# Patient Record
Sex: Male | Born: 1995 | Race: White | Hispanic: No | Marital: Single | Smoking: Former smoker
Health system: Southern US, Community
[De-identification: ages and names within clinical notes are randomized; demographics above are authoritative.]

## PROBLEM LIST (undated history)

## (undated) HISTORY — PX: HERNIA REPAIR: SHX51

## (undated) HISTORY — PX: TONSILLECTOMY: SUR1361

---
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
## 2014-01-08 ENCOUNTER — Emergency Department: Payer: Self-pay | Admitting: Emergency Medicine

## 2015-01-11 ENCOUNTER — Emergency Department
Admit: 2015-01-11 | Disposition: A | Discharge status: Home / Self Care | Payer: Self-pay | Admitting: Emergency Medicine

## 2017-07-18 ENCOUNTER — Encounter: Payer: Self-pay | Admitting: Emergency Medicine

## 2017-07-18 ENCOUNTER — Emergency Department
Admission: EM | Admit: 2017-07-18 | Discharge: 2017-07-18 | Disposition: A | Discharge status: Home / Self Care | Payer: Self-pay | Attending: Emergency Medicine | Admitting: Emergency Medicine

## 2017-07-18 ENCOUNTER — Emergency Department: Payer: Self-pay

## 2017-07-18 DIAGNOSIS — F17210 Nicotine dependence, cigarettes, uncomplicated: Secondary | ICD-10-CM | POA: Insufficient documentation

## 2017-07-18 DIAGNOSIS — Y929 Unspecified place or not applicable: Secondary | ICD-10-CM | POA: Insufficient documentation

## 2017-07-18 DIAGNOSIS — Y9351 Activity, roller skating (inline) and skateboarding: Secondary | ICD-10-CM | POA: Insufficient documentation

## 2017-07-18 DIAGNOSIS — Y999 Unspecified external cause status: Secondary | ICD-10-CM | POA: Insufficient documentation

## 2017-07-18 DIAGNOSIS — X501XXA Overexertion from prolonged static or awkward postures, initial encounter: Secondary | ICD-10-CM | POA: Insufficient documentation

## 2017-07-18 DIAGNOSIS — S93401A Sprain of unspecified ligament of right ankle, initial encounter: Secondary | ICD-10-CM | POA: Insufficient documentation

## 2017-07-18 MED ORDER — TRAMADOL HCL 50 MG PO TABS: 50.0000 mg | ORAL_TABLET | Freq: Two times a day (BID) | ORAL | 0 refills | Status: DC

## 2017-07-18 MED ORDER — NABUMETONE 750 MG PO TABS: 750.0000 mg | ORAL_TABLET | Freq: Two times a day (BID) | ORAL | 0 refills | Status: DC

## 2017-07-18 MED ORDER — OXYCODONE-ACETAMINOPHEN 5-325 MG PO TABS
1.0000 | ORAL_TABLET | Freq: Once | ORAL | Status: AC
  Administered 2017-07-18: 1 via ORAL
  Filled 2017-07-18: qty 1

## 2017-07-18 NOTE — ED Triage Notes (Signed)
Pt arrived with complaints of right ankle pain. Pt reports skateboarding and had two back to back injuries to ankle within the last hour. Pt's ankle swollen and tender. Pt able to move toes and pulses palpable.

## 2017-07-18 NOTE — ED Provider Notes (Signed)
[facility] Emergency Department Provider Note ____________________________________________  Time seen: 2136  I have reviewed the triage vital signs and the nursing notes.  HISTORY  Chief Complaint  Ankle Pain  HPI Hayden Medina is a 21 y.o. male patient presents to the ED, accompanied by a friend, for evaluation of injury to the right ankle.  He describes 2 separate ankle injuries while skateboarding today.  Initially he describes failing to land a jump of about 5 feet and landed on his right foot.  He then repaired reports another injury where he went to stand up after a fall, and rolled his right ankle.  He presents now with lateral ankle pain and swelling.  He reports pain is increased with attempts to ambulate.  He denies any other injury at this time.  History reviewed. No pertinent past medical history.  There are no active problems to display for this patient.  Past Surgical History:  Procedure Laterality Date  . HERNIA REPAIR    . TONSILLECTOMY      Prior to Admission medications   Medication Sig Start Date End Date Taking? Authorizing Provider  nabumetone (RELAFEN) 750 MG tablet Take 1 tablet (750 mg total) 2 (two) times daily by mouth. 07/18/17   Audiel Scheiber, Charlesetta Ivory, PA-C  traMADol (ULTRAM) 50 MG tablet Take 1 tablet (50 mg total) 2 (two) times daily by mouth. 07/18/17   Lesha Jager, Charlesetta Ivory, PA-C    Allergies Augmentin [amoxicillin-pot clavulanate]  No family history on file.  Social History Social History   Tobacco Use  . Smoking status: Current Every Day Smoker  . Smokeless tobacco: Never Used  Substance Use Topics  . Alcohol use: No    Frequency: Never  . Drug use: No    Review of Systems  Constitutional: Negative for fever. Musculoskeletal: Negative for back pain.  Ankle as above. Skin: Negative for rash. Neurological: Negative for headaches, focal weakness or  numbness. ____________________________________________  PHYSICAL EXAM:  VITAL SIGNS: ED Triage Vitals  Enc Vitals Group     BP 07/18/17 2042 (!) 125/57     Pulse Rate 07/18/17 2042 76     Resp 07/18/17 2042 18     Temp 07/18/17 2042 98.8 F (37.1 C)     Temp Source 07/18/17 2042 Oral     SpO2 07/18/17 2042 100 %     Weight 07/18/17 2041 130 lb (59 kg)     Height 07/18/17 2041 5\' 11"  (1.803 m)     Head Circumference --      Peak Flow --      Pain Score 07/18/17 2041 9     Pain Loc --      Pain Edu? --      Excl. in GC? --     Constitutional: Alert and oriented. Well appearing and in no distress. Head: Normocephalic and atraumatic. Cardiovascular: Normal rate, regular rhythm. Normal distal pulses and cap refill. Respiratory: Normal respiratory effort.  Musculoskeletal: Right ankle with obvious soft tissue swelling about the lateral malleolus.  Patient is tender to palpation over the ATF, CF, and PTF ligaments respectively.  There is also tenderness to palpation over the lateral malleolus.  Early ecchymosis noted to the lateral aspect of the foot.  Patient is able to flex and extend the toes without difficulty.  He is also able to demonstrate flexion extension range at the ankle, however limited by his pain.  No calf or Achilles tendons is appreciated.  Nontender with normal range  of motion in all extremities.  Neurologic:  Normal gross sensation. Normal speech and language. No gross focal neurologic deficits are appreciated. Skin:  Skin is warm, dry and intact. No rash noted. ____________________________________________   RADIOLOGY  Right Ankle  ____________________________________________  PROCEDURES  Percocet 5-325 mg PO Stirrup splint crutches ____________________________________________  INITIAL IMPRESSION / ASSESSMENT AND PLAN / ED COURSE  Patient with ED evaluation of acute right ankle sprain and swelling.  His exam and x-ray are consistent with a right ankle  sprain without underlying fracture or dislocation.  Space in a stirrup splint and given crutches to ambulate with weightbearing as tolerated.  Prescriptions for Relafen and Ultram are provided for pain relief he is referred to podiatry for ongoing symptom management. ____________________________________________  FINAL CLINICAL IMPRESSION(S) / ED DIAGNOSES  Final diagnoses:  Sprain of right ankle, unspecified ligament, initial encounter     Lissa HoardMenshew, Leean Amezcua V Bacon, PA-C 07/18/17 2217    Sharman CheekStafford, Phillip, MD 07/18/17 [phone]

## 2017-07-18 NOTE — Discharge Instructions (Signed)
Your exam and x-ray are consistent with a Grade II ankle sprain. Wear the ankle stirrup as needed for the next week or so. Rest with the leg elevated to reduce pain and swelling. Use the crutches to ambulate, until you can bear weight through your ankle. Take the prescription meds as directed. Follow-up with Dr. Orland Jarredroxler for ongoing symptoms. Begin the ankle exercises tomorrow, with simple, slow range of motion.

## 2017-10-20 ENCOUNTER — Other Ambulatory Visit: Payer: Self-pay

## 2017-10-20 ENCOUNTER — Encounter: Payer: Self-pay | Admitting: Emergency Medicine

## 2017-10-20 ENCOUNTER — Emergency Department: Payer: Medicaid Other

## 2017-10-20 ENCOUNTER — Emergency Department
Admission: EM | Admit: 2017-10-20 | Discharge: 2017-10-20 | Disposition: A | Discharge status: Home / Self Care | Payer: Medicaid Other | Attending: Emergency Medicine | Admitting: Emergency Medicine

## 2017-10-20 DIAGNOSIS — Y939 Activity, unspecified: Secondary | ICD-10-CM | POA: Insufficient documentation

## 2017-10-20 DIAGNOSIS — Y929 Unspecified place or not applicable: Secondary | ICD-10-CM | POA: Insufficient documentation

## 2017-10-20 DIAGNOSIS — Z79899 Other long term (current) drug therapy: Secondary | ICD-10-CM | POA: Insufficient documentation

## 2017-10-20 DIAGNOSIS — W109XXA Fall (on) (from) unspecified stairs and steps, initial encounter: Secondary | ICD-10-CM | POA: Insufficient documentation

## 2017-10-20 DIAGNOSIS — S93491A Sprain of other ligament of right ankle, initial encounter: Secondary | ICD-10-CM

## 2017-10-20 DIAGNOSIS — M25571 Pain in right ankle and joints of right foot: Secondary | ICD-10-CM

## 2017-10-20 DIAGNOSIS — F17200 Nicotine dependence, unspecified, uncomplicated: Secondary | ICD-10-CM | POA: Insufficient documentation

## 2017-10-20 DIAGNOSIS — Y999 Unspecified external cause status: Secondary | ICD-10-CM | POA: Insufficient documentation

## 2017-10-20 MED ORDER — FENTANYL CITRATE (PF) 100 MCG/2ML IJ SOLN
50.0000 ug | INTRAMUSCULAR | Status: AC | PRN
  Administered 2017-10-20 (×2): 50 ug via NASAL
  Filled 2017-10-20: qty 2

## 2017-10-20 MED ORDER — ONDANSETRON 4 MG PO TBDP
ORAL_TABLET | ORAL | Status: AC
  Filled 2017-10-20: qty 1

## 2017-10-20 MED ORDER — IBUPROFEN 800 MG PO TABS: 800.0000 mg | ORAL_TABLET | Freq: Three times a day (TID) | ORAL | 0 refills | Status: DC | PRN

## 2017-10-20 MED ORDER — ONDANSETRON 4 MG PO TBDP
4.0000 mg | ORAL_TABLET | Freq: Once | ORAL | Status: AC | PRN
  Administered 2017-10-20: 4 mg via ORAL

## 2017-10-20 NOTE — ED Triage Notes (Signed)
Patient to ER for c/o right ankle pain after ankle twisting while walking down stairs. Patient states he was already using crutches d/t tendon injury from skateboarding a few weeks ago. Patient does report falling, did not injure anything other than ankle. Patient has obvious deformity to right ankle and appears to be in large amount of pain.

## 2017-10-20 NOTE — ED Notes (Signed)
Pt has noticeable swelling with some slight bruising to the outside of the right ankle. Pt unable move ankle but is able to wiggle toes. Circulation is still intact

## 2017-10-20 NOTE — ED Provider Notes (Signed)
[facility] EMERGENCY DEPARTMENT Provider Note   CSN: [id number] Arrival date & time: 10/20/17  2056     History   Chief Complaint Chief Complaint  Patient presents with  . Ankle Pain    HPI Hayden Medina is a 22 y.o. male.  Patient states he was walking down steps when he rolled his right ankle.  He has lateral ankle pain denies any medial ankle pain denies any other pain throughout the lower extremity such as the knee or hip.  He denies any discomfort throughout the foot.  No head injury, neck or back pain.  His pain is moderate.  He is unable to bear weight on the right lower extremity.  HPI  History reviewed. No pertinent past medical history.  There are no active problems to display for this patient.   Past Surgical History:  Procedure Laterality Date  . HERNIA REPAIR    . TONSILLECTOMY         Home Medications    Prior to Admission medications   Medication Sig Start Date End Date Taking? Authorizing Provider  ibuprofen (ADVIL,MOTRIN) 800 MG tablet Take 1 tablet (800 mg total) by mouth every 8 (eight) hours as needed. 10/20/17   Evon SlackGaines, Thomas C, PA-C  nabumetone (RELAFEN) 750 MG tablet Take 1 tablet (750 mg total) 2 (two) times daily by mouth. 07/18/17   Menshew, Charlesetta IvoryJenise V Bacon, PA-C  traMADol (ULTRAM) 50 MG tablet Take 1 tablet (50 mg total) 2 (two) times daily by mouth. 07/18/17   Menshew, Charlesetta IvoryJenise V Bacon, PA-C    Family History No family history on file.  Social History Social History   Tobacco Use  . Smoking status: Current Every Day Smoker  . Smokeless tobacco: Never Used  Substance Use Topics  . Alcohol use: No    Frequency: Never  . Drug use: No     Allergies   Augmentin [amoxicillin-pot clavulanate]   Review of Systems Review of Systems  Constitutional: Negative for fever.  Respiratory: Negative for shortness of breath.   Cardiovascular: Negative for chest pain.  Musculoskeletal: Positive for arthralgias and gait  problem. Negative for back pain, myalgias and neck pain.  Skin: Negative for rash.  Neurological: Negative for dizziness and headaches.     Physical Exam Updated Vital Signs BP 105/87 (BP Location: Right Arm)   Pulse (!) 103   Temp 98.7 F (37.1 C) (Axillary)   Resp (!) 24   Ht 5\' 11"  (1.803 m)   Wt 68 kg (150 lb)   SpO2 100%   BMI 20.92 kg/m   Physical Exam  Constitutional: He is oriented to person, place, and time. He appears well-developed and well-nourished.  HENT:  Head: Normocephalic and atraumatic.  Eyes: Conjunctivae are normal.  Neck: Normal range of motion.  Cardiovascular: Normal rate.  Pulmonary/Chest: Effort normal. No respiratory distress.  Musculoskeletal:  Examination of the right lower extremity shows patient has soft tissue swelling along the lateral aspect the ankle.  Patient is tender along the lateral malleolus and ATFL ligament.  Nontender along the medial malleolus.  Patient has limited ankle plantarflexion and dorsiflexion but has a negative Thompson sign.  Patient is nontender along the Achilles tendon.  No tenderness throughout the metatarsals or calcaneus.  He is nontender throughout the knee or mid to proximal tib-fib region.  Neurological: He is alert and oriented to person, place, and time.  Skin: Skin is warm. No rash noted.  Psychiatric: He has a normal mood and affect.  His behavior is normal. Thought content normal.     ED Treatments / Results  Labs (all labs ordered are listed, but only abnormal results are displayed) Labs Reviewed - No data to display  EKG  EKG Interpretation None       Radiology Dg Ankle Complete Right  Result Date: 10/20/2017 CLINICAL DATA:  22 y/o  M; twisting injury with pain. EXAM: RIGHT ANKLE - COMPLETE 3+ VIEW COMPARISON:  07/18/2017 right ankle radiographs. FINDINGS: No acute fracture or dislocation identified. Talar dome is intact. Ankle mortise is symmetric on these nonstress views. Soft tissue swelling  over lateral malleolus and ankle joint effusion. IMPRESSION: No acute fracture or dislocation identified. Soft tissue swelling over lateral malleolus and ankle joint effusion. Electronically Signed   By: Mitzi Hansen M.D.   On: 10/20/2017 21:27    Procedures .Splint Application Date/Time: 10/20/2017 10:53 PM Performed by: Evon Slack, PA-C Authorized by: Evon Slack, PA-C   Consent:    Consent obtained:  Verbal   Consent given by:  Patient   Risks discussed:  Numbness   Alternatives discussed:  No treatment Pre-procedure details:    Sensation:  Normal Procedure details:    Laterality:  Right   Location:  Ankle   Ankle:  R ankle   Splint type:  Ankle stirrup   Supplies:  Prefabricated splint Post-procedure details:    Pain:  Unchanged   Sensation:  Normal   Patient tolerance of procedure:  Tolerated well, no immediate complications Comments:     Patient given crutches to help with ambulation.   (including critical care time)  Medications Ordered in ED Medications  fentaNYL (SUBLIMAZE) injection 50 mcg (50 mcg Nasal Given 10/20/17 2125)  ondansetron (ZOFRAN-ODT) disintegrating tablet 4 mg (4 mg Oral Given 10/20/17 2109)     Initial Impression / Assessment and Plan / ED Course  I have reviewed the triage vital signs and the nursing notes.  Pertinent labs & imaging results that were available during my care of the patient were reviewed by me and considered in my medical decision making (see chart for details).     21 year old male with right lateral ankle sprain.  No evidence of acute bony normality on x-rays that were reviewed by me today.  Patient is placed into ankle stirrup splint and given crutches.  He will take Tylenol and ibuprofen as needed for pain.  Rest ice and elevate the ankle and follow-up with orthopedics if no improvement in 2-3 weeks.  Final Clinical Impressions(s) / ED Diagnoses   Final diagnoses:  Acute right ankle pain  Sprain of  anterior talofibular ligament of right ankle, initial encounter    ED Discharge Orders        Ordered    ibuprofen (ADVIL,MOTRIN) 800 MG tablet  Every 8 hours PRN     10/20/17 2239       Evon Slack, PA-C 10/20/17 2254    Arnaldo Natal, MD 10/20/17 [phone]

## 2017-10-20 NOTE — Discharge Instructions (Addendum)
Please wear ankle brace.  Rest ice and elevate the ankle.  Would recommend ankle lace up brace once you are able to begin bearing weight for at least 6 weeks.  Follow-up with orthopedics in 1-2 weeks for recheck.  He may benefit from physical therapy.

## 2019-07-07 ENCOUNTER — Emergency Department
Admission: EM | Admit: 2019-07-07 | Discharge: 2019-07-07 | Disposition: A | Discharge status: Home / Self Care | Payer: Self-pay | Attending: Emergency Medicine | Admitting: Emergency Medicine

## 2019-07-07 ENCOUNTER — Emergency Department: Payer: Self-pay

## 2019-07-07 ENCOUNTER — Other Ambulatory Visit: Payer: Self-pay

## 2019-07-07 ENCOUNTER — Encounter: Payer: Self-pay | Admitting: Emergency Medicine

## 2019-07-07 DIAGNOSIS — S60221A Contusion of right hand, initial encounter: Secondary | ICD-10-CM | POA: Insufficient documentation

## 2019-07-07 DIAGNOSIS — S62665A Nondisplaced fracture of distal phalanx of left ring finger, initial encounter for closed fracture: Secondary | ICD-10-CM | POA: Insufficient documentation

## 2019-07-07 DIAGNOSIS — Y92018 Other place in single-family (private) house as the place of occurrence of the external cause: Secondary | ICD-10-CM | POA: Insufficient documentation

## 2019-07-07 DIAGNOSIS — Y9389 Activity, other specified: Secondary | ICD-10-CM | POA: Insufficient documentation

## 2019-07-07 DIAGNOSIS — F17200 Nicotine dependence, unspecified, uncomplicated: Secondary | ICD-10-CM | POA: Insufficient documentation

## 2019-07-07 DIAGNOSIS — Y998 Other external cause status: Secondary | ICD-10-CM | POA: Insufficient documentation

## 2019-07-07 MED ORDER — TRAMADOL HCL 50 MG PO TABS: 50.0000 mg | ORAL_TABLET | Freq: Four times a day (QID) | ORAL | 0 refills | Status: AC | PRN

## 2019-07-07 MED ORDER — ONDANSETRON 4 MG PO TBDP
4.0000 mg | ORAL_TABLET | Freq: Once | ORAL | Status: AC
  Administered 2019-07-07: 4 mg via ORAL
  Filled 2019-07-07: qty 1

## 2019-07-07 MED ORDER — HYDROCODONE-ACETAMINOPHEN 5-325 MG PO TABS
1.0000 | ORAL_TABLET | Freq: Once | ORAL | Status: AC
Start: 2019-07-07 — End: 2019-07-07
  Administered 2019-07-07: 1 via ORAL
  Filled 2019-07-07: qty 1
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
## 2021-12-21 ENCOUNTER — Emergency Department: Payer: Medicaid Other

## 2021-12-21 DIAGNOSIS — S2241XD Multiple fractures of ribs, right side, subsequent encounter for fracture with routine healing: Secondary | ICD-10-CM | POA: Insufficient documentation

## 2021-12-21 DIAGNOSIS — Y831 Surgical operation with implant of artificial internal device as the cause of abnormal reaction of the patient, or of later complication, without mention of misadventure at the time of the procedure: Secondary | ICD-10-CM | POA: Insufficient documentation

## 2021-12-21 DIAGNOSIS — T847XXA Infection and inflammatory reaction due to other internal orthopedic prosthetic devices, implants and grafts, initial encounter: Secondary | ICD-10-CM | POA: Insufficient documentation

## 2021-12-21 LAB — BASIC METABOLIC PANEL
Anion gap: 8 (ref 5–15)
BUN: 21 mg/dL — ABNORMAL HIGH (ref 6–20)
CO2: 28 mmol/L (ref 22–32)
Calcium: 9.2 mg/dL (ref 8.9–10.3)
Chloride: 101 mmol/L (ref 98–111)
Creatinine, Ser: 0.97 mg/dL (ref 0.61–1.24)
GFR, Estimated: >60 mL/min (ref >60)
Glucose, Bld: 98 mg/dL (ref 70–99)
Potassium: 3.8 mmol/L (ref 3.5–5.1)
Sodium: 137 mmol/L (ref 135–145)

## 2021-12-21 LAB — CBC
HCT: 41.5 % (ref 39.0–52.0)
Hemoglobin: 13.4 g/dL (ref 13.0–17.0)
MCH: 27.1 pg (ref 26.0–34.0)
MCHC: 32.3 g/dL (ref 30.0–36.0)
MCV: 83.8 fL (ref 80.0–100.0)
Platelets: 299 10*3/uL (ref 150–400)
RBC: 4.95 MIL/uL (ref 4.22–5.81)
RDW: 14.3 % (ref 11.5–15.5)
WBC: 10 10*3/uL (ref 4.0–10.5)
nRBC: 0 % (ref 0.0–0.2)
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
## 2021-12-22 ENCOUNTER — Emergency Department
Admission: EM | Admit: 2021-12-22 | Discharge: 2021-12-22 | Disposition: A | Discharge status: Home / Self Care | Payer: Medicaid Other | Attending: Emergency Medicine | Admitting: Emergency Medicine

## 2021-12-22 DIAGNOSIS — S2241XD Multiple fractures of ribs, right side, subsequent encounter for fracture with routine healing: Secondary | ICD-10-CM

## 2021-12-22 DIAGNOSIS — T84498A Other mechanical complication of other internal orthopedic devices, implants and grafts, initial encounter: Secondary | ICD-10-CM

## 2021-12-22 MED ORDER — CEPHALEXIN 500 MG PO CAPS: 500.0000 mg | ORAL_CAPSULE | Freq: Four times a day (QID) | ORAL | 0 refills | Status: AC

## 2021-12-22 NOTE — ED Notes (Signed)
Pt declined discharge vitals and was not able to sign for discharge due to leaving prior to signing  ?Pt did receive discharge paperwork ?
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
## 2023-02-27 ENCOUNTER — Emergency Department
Admission: EM | Admit: 2023-02-27 | Discharge: 2023-02-27 | Disposition: A | Discharge status: Home / Self Care | Payer: BC Managed Care – PPO | Attending: Emergency Medicine | Admitting: Emergency Medicine

## 2023-02-27 ENCOUNTER — Other Ambulatory Visit: Payer: Self-pay

## 2023-02-27 DIAGNOSIS — R42 Dizziness and giddiness: Secondary | ICD-10-CM | POA: Diagnosis not present

## 2023-02-27 DIAGNOSIS — T675XXA Heat exhaustion, unspecified, initial encounter: Secondary | ICD-10-CM

## 2023-02-27 DIAGNOSIS — M791 Myalgia, unspecified site: Secondary | ICD-10-CM | POA: Diagnosis not present

## 2023-02-27 DIAGNOSIS — E860 Dehydration: Secondary | ICD-10-CM

## 2023-02-27 LAB — COMPREHENSIVE METABOLIC PANEL
ALT: 9 U/L (ref 0–44)
AST: 25 U/L (ref 15–41)
Albumin: 4.7 g/dL (ref 3.5–5.0)
Alkaline Phosphatase: 84 U/L (ref 38–126)
Anion gap: 8 (ref 5–15)
BUN: 21 mg/dL — ABNORMAL HIGH (ref 6–20)
CO2: 27 mmol/L (ref 22–32)
Calcium: 9.2 mg/dL (ref 8.9–10.3)
Chloride: 104 mmol/L (ref 98–111)
Creatinine, Ser: 1.01 mg/dL (ref 0.61–1.24)
GFR, Estimated: >60 mL/min (ref >60)
Glucose, Bld: 111 mg/dL — ABNORMAL HIGH (ref 70–99)
Potassium: 3.5 mmol/L (ref 3.5–5.1)
Sodium: 139 mmol/L (ref 135–145)
Total Bilirubin: 0.8 mg/dL (ref 0.3–1.2)
Total Protein: 7.6 g/dL (ref 6.5–8.1)

## 2023-02-27 LAB — CBC WITH DIFFERENTIAL/PLATELET
Abs Immature Granulocytes: 0.03 10*3/uL (ref 0.00–0.07)
Basophils Absolute: 0 10*3/uL (ref 0.0–0.1)
Basophils Relative: 0 %
Eosinophils Absolute: 0.1 10*3/uL (ref 0.0–0.5)
Eosinophils Relative: 1 %
HCT: 43.9 % (ref 39.0–52.0)
Hemoglobin: 14.3 g/dL (ref 13.0–17.0)
Immature Granulocytes: 0 %
Lymphocytes Relative: 30 %
Lymphs Abs: 2.3 10*3/uL (ref 0.7–4.0)
MCH: 29.5 pg (ref 26.0–34.0)
MCHC: 32.6 g/dL (ref 30.0–36.0)
MCV: 90.7 fL (ref 80.0–100.0)
Monocytes Absolute: 0.6 10*3/uL (ref 0.1–1.0)
Monocytes Relative: 8 %
Neutro Abs: 4.6 10*3/uL (ref 1.7–7.7)
Neutrophils Relative %: 61 %
Platelets: 300 10*3/uL (ref 150–400)
RBC: 4.84 MIL/uL (ref 4.22–5.81)
RDW: 11.9 % (ref 11.5–15.5)
WBC: 7.6 10*3/uL (ref 4.0–10.5)
nRBC: 0 % (ref 0.0–0.2)

## 2023-02-27 LAB — URINALYSIS, ROUTINE W REFLEX MICROSCOPIC
Bilirubin Urine: NEGATIVE
Glucose, UA: NEGATIVE mg/dL
Hgb urine dipstick: NEGATIVE
Ketones, ur: NEGATIVE mg/dL
Leukocytes,Ua: NEGATIVE
Nitrite: NEGATIVE
Protein, ur: NEGATIVE mg/dL
Specific Gravity, Urine: 1.026 (ref 1.005–1.030)
pH: 5 (ref 5.0–8.0)

## 2023-02-27 LAB — CK: Total CK: 102 U/L (ref 49–397)

## 2023-02-27 LAB — LIPASE, BLOOD: Lipase: 37 U/L (ref 11–51)

## 2023-02-27 MED ORDER — SODIUM CHLORIDE 0.9 % IV BOLUS
1000.0000 mL | Freq: Once | INTRAVENOUS | Status: AC
  Administered 2023-02-27: 1000 mL via INTRAVENOUS

## 2023-02-27 MED ORDER — ONDANSETRON HCL 4 MG/2ML IJ SOLN
4.0000 mg | Freq: Once | INTRAMUSCULAR | Status: AC
  Administered 2023-02-27: 4 mg via INTRAVENOUS
  Filled 2023-02-27: qty 2
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
## 2023-02-27 NOTE — ED Triage Notes (Signed)
Patient states he thinks he got overheated last night at work, has been dizzy and complaining of vomiting; denies abdominal pain.
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
## 2024-04-21 ENCOUNTER — Emergency Department
Admission: EM | Admit: 2024-04-21 | Discharge: 2024-04-21 | Disposition: A | Discharge status: Home / Self Care | Attending: Emergency Medicine | Admitting: Emergency Medicine

## 2024-04-21 ENCOUNTER — Emergency Department

## 2024-04-21 ENCOUNTER — Other Ambulatory Visit: Payer: Self-pay

## 2024-04-21 DIAGNOSIS — S39012A Strain of muscle, fascia and tendon of lower back, initial encounter: Secondary | ICD-10-CM | POA: Diagnosis not present

## 2024-04-21 DIAGNOSIS — M545 Low back pain, unspecified: Secondary | ICD-10-CM | POA: Diagnosis present

## 2024-04-21 DIAGNOSIS — Y92410 Unspecified street and highway as the place of occurrence of the external cause: Secondary | ICD-10-CM | POA: Insufficient documentation

## 2024-04-21 MED ORDER — PREDNISONE 50 MG PO TABS: 50.0000 mg | ORAL_TABLET | Freq: Every day | ORAL | 0 refills | Status: AC

## 2024-04-21 MED ORDER — NAPROXEN 500 MG PO TABS: 500.0000 mg | ORAL_TABLET | Freq: Two times a day (BID) | ORAL | 2 refills | Status: AC

## 2024-04-21 NOTE — ED Notes (Signed)
 Pt verbalizes understanding of discharge instructions. Opportunity for questioning and answers were provided. Pt discharged from ED to home.   ? ?
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
## 2024-09-29 ENCOUNTER — Ambulatory Visit: Payer: Self-pay

## 2024-10-03 ENCOUNTER — Ambulatory Visit: Payer: Self-pay

## 2024-10-03 DIAGNOSIS — Z113 Encounter for screening for infections with a predominantly sexual mode of transmission: Secondary | ICD-10-CM

## 2024-10-03 DIAGNOSIS — Z202 Contact with and (suspected) exposure to infections with a predominantly sexual mode of transmission: Secondary | ICD-10-CM

## 2024-10-03 MED ORDER — DOXYCYCLINE HYCLATE 100 MG PO TABS: 100.0000 mg | ORAL_TABLET | Freq: Two times a day (BID) | ORAL | Status: AC

## 2024-10-03 NOTE — Progress Notes (Signed)
 " [facility] Department STI clinic [address], [address] [place] [address] Main phone: [phone]  STI screening visit  Subjective:  Hayden Medina is a 29 y.o. male being seen today for an STI screening visit. The patient reports they do not have symptoms.    Patient has the following medical conditions:  There are no active problems to display for this patient.  Chief Complaint  Patient presents with   SEXUALLY TRANSMITTED DISEASE   HPI Patient reports exposure to chlamydia, no symptoms. 1 partner in the last 2 months. He does not want any blood tests today.  Reproductive considerations: Does the patient or their partner desires a pregnancy in the next year? No  See flowsheet for further details and programmatic requirements  Hyperlink available at the top of the signed note in blue.  Flow sheet content below:  Pregnancy Intention Screening Does the patient want to become pregnant in the next year?: N/A Does the patient's partner want to become pregnant in the next year?: No Would the patient like to discuss contraceptive options today?: N/A Reason For STD Screen STD Screening: Is a contact Have you ever had an STD?: No History of Antibiotic use in the past 2 weeks?: No STD Symptoms Denies all: Yes Counseling Medication side effects discussed with patient?: Yes Patient counseled to abstain from sex for: 14 days Patient counseled to abstain from sex for?: 7 days post partner's treatment Patient counseled to avoid alcohol for?: 10 days Patient counseled to use condoms with all sex: Condoms declined RTC in 2-3 weeks for test results: Yes Clinic will call if test results abnormal before test result appt.: Yes Patient should return to the clinic for re-treatment if vomits within 2 hours after taking meds   : Yes Immunizations: Referred, Immunization history assessed Test results given to patient Patient counseled to use condoms with all  sex: Condoms declined STD Treatment Patient counseled to abstain from sex for: 14 days Patient counseled to abstain from sex for?: 7 days post partner's treatment Patient counseled to avoid alcohol for?: 10 days  Screening for MPX risk:  Unexplained rash?  No   MSM?  No   Multiple or anonymous sex partners?  No   Any close or sexual contact with a person  diagnosed with MPX?  No   Any outside the [place]  where MPX is endemic?  No   High clinical suspicion for MPX?    -Unlikely to be chickenpox    -Lymphadenopathy    -Rash that presents in same phase of       evolution on any given body part  No   Does this patient meet CDC recommendations for vaccination against MPOX? No  You already have or anticipate having the following risks:  Your sex partner has the following risks: You're traveling to a county with a clade I MPOX outbreak and anticipate these risks: Occupational exposure  You had known or suspected exposure to someone with monkeypox You had a sex partner in the past 2 weeks who was diagnosed with monkeypox You are a gay, bisexual, or other man who has sex with men, or are transgender or nonbinary and in the past 6 months have had any of the following: - A new diagnosis of one or more sexually transmitted diseases (e.g., chlamydia, gonorrhea, or syphilis) - More than one sex partner You have had any of the following in the past 6 months: - Sex at a commercial sex venue (like a  sex club or bathhouse) - Sex related to a large commercial event   or in a geographic area (city or county for example) where mpox virus transmission is occurring Sex with a new partner Sex at a commercial sex venue (e.g., a sex club or bathhouse) Sex in it consultant for money, goods, drugs, or other trade Sex in association with a large public event (e.g., a rave, party, or festival) i.e. certain people who work in a tax adviser or healthcare facility   Infectious disease screenings: Vaccinated against HPV?  Yes  HIV Ever had a positive? No Last test: 2024 Results in chart:  No results found for: HMHIVSCREEN No results found for: HIV   Hep B Hep B status: unknown or no prior testing Received HBV vaccination? Yes Received HBV testing for immunity? Unknown Results in chart:  No components found for: HMHEPBSCREEN  Do they qualify for HBV screening today? Declines testing Criteria:  -Household, sexual or needle sharing contact with HBV -History of drug use or homelessness -HIV positive -Those with known Hep C  Hep C Hep C status: unknown or no prior testing Results in chart:  No results found for: HMHEPCSCREEN No components found for: HEPC  Do they qualify for HCV screening today? Declines testing Criteria - since the last HCV result, does the patient have any of the following? - Current drug use - Have a partner with drug use - Has been incarcerated  Immunization history:   There is no immunization history on file for this patient.  The following portions of the patient's history were reviewed and updated as appropriate: allergies, current medications, past medical history, past social history, past surgical history and problem list.  Substance use screenings:  Uses tobacco products? No Uses vapes? Yes Uses alcohol? No Uses non-injectable substances that alter your mental status? No Uses non-prescribed injectable substances? No  There is no immunization history on file for this patient.  The following portions of the patient's history were reviewed and updated as appropriate: allergies, current medications, past medical history, past social history, past surgical history and problem list.  Objective:  There were no vitals filed for this visit.  Physical Exam Vitals and nursing note reviewed.  Constitutional:      Appearance: Normal appearance.  HENT:     Head: Normocephalic and atraumatic.     Comments: No nits or hair loss of scalp, brows, and lashes     Mouth/Throat:     Mouth: Mucous membranes are moist.     Pharynx: Oropharynx is clear. No oropharyngeal exudate or posterior oropharyngeal erythema.  Eyes:     General:        Right eye: No discharge.        Left eye: No discharge.     Conjunctiva/sclera: Conjunctivae normal.     Right eye: Right conjunctiva is not injected. No exudate.    Left eye: Left conjunctiva is not injected. No exudate. Pulmonary:     Effort: Pulmonary effort is normal.  Abdominal:     Palpations: There is no hepatomegaly.  Genitourinary:    Comments: Politely declined genital exam. Lymphadenopathy:     Cervical: No cervical adenopathy.     Upper Body:     Right upper body: No supraclavicular adenopathy.     Left upper body: No supraclavicular adenopathy.     Comments: Patient declines genital exam. Inguinal lymph nodes not assessed.   Skin:    General: Skin is warm and dry.     Findings: No  lesion or rash.  Neurological:     Mental Status: He is alert and oriented to person, place, and time.    Assessment and Plan:  Hayden Medina is a 29 y.o. male presenting to the [facility] Department for STI screening.  Patient accepted the following screenings: urine CT/GC  1. Chlamydia contact (Primary)  - doxycycline  (VIBRA -TABS) 100 MG tablet; Take 1 tablet (100 mg total) by mouth 2 (two) times daily for 7 days. - Discussed him and his partner finishing medication in its entirety, no sex for 1 week after finishing medication   2. Screening examination for sexually transmitted disease  - doxycycline  (VIBRA -TABS) 100 MG tablet; Take 1 tablet (100 mg total) by mouth 2 (two) times daily for 7 days. - Chlamydia/GC NAA, Confirmation  Counseling: Recommended condom use with all sex Discussed importance of condom use for STI prevention Discussed time line for State Lab results and that patient will be called with positive results and encouraged patient to call if they had not heard in 2  weeks Recommended repeat testing in 3 months with positive results. Recommended returning for continued or worsening symptoms.   Return in about 3 months (around 01/01/2025).  No future appointments.  Damien FORBES Satchel, NP "

## 2024-10-03 NOTE — Progress Notes (Signed)
 Pt is here for STD screening and a contact to chlamydia. The patient was dispensed doxycyline 100 mg capsules 2x/day for 7 days. I provided counseling today regarding the medication, the side effects and when to call clinic. Patient was given the opportunity to ask questions for any clarifications. Questions answered. Brochure given and condoms declined. Wilkie Drought, RN.

## 2024-10-05 ENCOUNTER — Telehealth: Payer: Self-pay | Admitting: Family Medicine

## 2024-10-06 ENCOUNTER — Ambulatory Visit: Payer: Self-pay

## 2024-10-06 LAB — CHLAMYDIA/GC NAA, CONFIRMATION
Chlamydia trachomatis, NAA: POSITIVE — AB
Neisseria gonorrhoeae, NAA: NEGATIVE

## 2024-10-06 LAB — C. TRACHOMATIS NAA, CONFIRM: C. trachomatis NAA, Confirm: POSITIVE — AB

## 2024-10-06 NOTE — Telephone Encounter (Signed)
 Returned patient call concerning his doxycycline . He reports he dropped a single pill into the toilet. Discussed missing one pill will likely still result in full treatment of his infection, but that he should return in 3 months for repeat testing to ensure cure.  Hayden Medina [facility]
